# Patient Record
Sex: Female | Born: 2005 | Race: White | Hispanic: No | Marital: Single | Smoking: Never smoker
Health system: Southern US, Community
[De-identification: ages and names within clinical notes are randomized; demographics above are authoritative.]

## PROBLEM LIST (undated history)

## (undated) DIAGNOSIS — R51 Headache: Secondary | ICD-10-CM

## (undated) HISTORY — DX: Headache: R51

---
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
## 2023-01-09 DIAGNOSIS — F331 Major depressive disorder, recurrent, moderate: Secondary | ICD-10-CM | POA: Diagnosis not present

## 2023-01-23 DIAGNOSIS — F331 Major depressive disorder, recurrent, moderate: Secondary | ICD-10-CM | POA: Diagnosis not present

## 2023-01-27 DIAGNOSIS — F331 Major depressive disorder, recurrent, moderate: Secondary | ICD-10-CM | POA: Diagnosis not present

## 2023-01-30 DIAGNOSIS — F331 Major depressive disorder, recurrent, moderate: Secondary | ICD-10-CM | POA: Diagnosis not present

## 2023-02-06 DIAGNOSIS — F331 Major depressive disorder, recurrent, moderate: Secondary | ICD-10-CM | POA: Diagnosis not present

## 2023-02-13 DIAGNOSIS — F331 Major depressive disorder, recurrent, moderate: Secondary | ICD-10-CM | POA: Diagnosis not present

## 2023-03-08 DIAGNOSIS — F331 Major depressive disorder, recurrent, moderate: Secondary | ICD-10-CM | POA: Diagnosis not present

## 2023-03-20 DIAGNOSIS — F331 Major depressive disorder, recurrent, moderate: Secondary | ICD-10-CM | POA: Diagnosis not present

## 2023-03-31 DIAGNOSIS — R1032 Left lower quadrant pain: Secondary | ICD-10-CM | POA: Diagnosis not present

## 2023-03-31 DIAGNOSIS — G8929 Other chronic pain: Secondary | ICD-10-CM | POA: Diagnosis not present

## 2023-04-13 DIAGNOSIS — F331 Major depressive disorder, recurrent, moderate: Secondary | ICD-10-CM | POA: Diagnosis not present

## 2023-04-14 DIAGNOSIS — R1032 Left lower quadrant pain: Secondary | ICD-10-CM | POA: Diagnosis not present

## 2023-04-14 DIAGNOSIS — G8929 Other chronic pain: Secondary | ICD-10-CM | POA: Diagnosis not present

## 2023-04-20 DIAGNOSIS — F331 Major depressive disorder, recurrent, moderate: Secondary | ICD-10-CM | POA: Diagnosis not present

## 2023-06-01 DIAGNOSIS — F331 Major depressive disorder, recurrent, moderate: Secondary | ICD-10-CM | POA: Diagnosis not present

## 2023-06-22 DIAGNOSIS — F331 Major depressive disorder, recurrent, moderate: Secondary | ICD-10-CM | POA: Diagnosis not present

## 2023-07-13 DIAGNOSIS — F331 Major depressive disorder, recurrent, moderate: Secondary | ICD-10-CM | POA: Diagnosis not present

## 2023-07-16 DIAGNOSIS — R1032 Left lower quadrant pain: Secondary | ICD-10-CM | POA: Diagnosis not present

## 2023-07-16 DIAGNOSIS — R319 Hematuria, unspecified: Secondary | ICD-10-CM | POA: Diagnosis not present

## 2023-07-27 DIAGNOSIS — F331 Major depressive disorder, recurrent, moderate: Secondary | ICD-10-CM | POA: Diagnosis not present

## 2023-08-10 DIAGNOSIS — F331 Major depressive disorder, recurrent, moderate: Secondary | ICD-10-CM | POA: Diagnosis not present
